# Patient Record
Sex: Female | Born: 1950 | Race: White | Hispanic: No | Smoking: Never smoker
Health system: Southern US, Community
[De-identification: ages and names within clinical notes are randomized; demographics above are authoritative.]

## PROBLEM LIST (undated history)

## (undated) DIAGNOSIS — I10 Essential (primary) hypertension: Secondary | ICD-10-CM

## (undated) DIAGNOSIS — C801 Malignant (primary) neoplasm, unspecified: Secondary | ICD-10-CM

## (undated) HISTORY — PX: OTHER SURGICAL HISTORY: SHX169

---

## 2017-02-07 ENCOUNTER — Encounter (HOSPITAL_COMMUNITY): Payer: Self-pay | Admitting: Cardiology

## 2017-02-07 ENCOUNTER — Emergency Department (HOSPITAL_COMMUNITY)
Admission: EM | Admit: 2017-02-07 | Discharge: 2017-02-07 | Disposition: A | Discharge status: Home / Self Care | Payer: 59 | Attending: Emergency Medicine | Admitting: Emergency Medicine

## 2017-02-07 ENCOUNTER — Emergency Department (HOSPITAL_COMMUNITY): Payer: 59

## 2017-02-07 DIAGNOSIS — R0789 Other chest pain: Secondary | ICD-10-CM

## 2017-02-07 DIAGNOSIS — Z79899 Other long term (current) drug therapy: Secondary | ICD-10-CM | POA: Insufficient documentation

## 2017-02-07 DIAGNOSIS — I10 Essential (primary) hypertension: Secondary | ICD-10-CM | POA: Insufficient documentation

## 2017-02-07 DIAGNOSIS — Z8541 Personal history of malignant neoplasm of cervix uteri: Secondary | ICD-10-CM | POA: Diagnosis not present

## 2017-02-07 DIAGNOSIS — F419 Anxiety disorder, unspecified: Secondary | ICD-10-CM | POA: Diagnosis not present

## 2017-02-07 DIAGNOSIS — R079 Chest pain, unspecified: Secondary | ICD-10-CM | POA: Diagnosis present

## 2017-02-07 DIAGNOSIS — M62838 Other muscle spasm: Secondary | ICD-10-CM | POA: Diagnosis not present

## 2017-02-07 HISTORY — DX: Essential (primary) hypertension: I10

## 2017-02-07 HISTORY — DX: Malignant (primary) neoplasm, unspecified: C80.1

## 2017-02-07 LAB — URINALYSIS, ROUTINE W REFLEX MICROSCOPIC
Bilirubin Urine: NEGATIVE
Glucose, UA: NEGATIVE mg/dL
Hgb urine dipstick: NEGATIVE
Ketones, ur: NEGATIVE mg/dL
NITRITE: NEGATIVE
PROTEIN: NEGATIVE mg/dL
SPECIFIC GRAVITY, URINE: 1.009 (ref 1.005–1.030)
pH: 5 (ref 5.0–8.0)

## 2017-02-07 LAB — COMPREHENSIVE METABOLIC PANEL
ALK PHOS: 139 U/L — AB (ref 38–126)
ALT: 18 U/L (ref 14–54)
AST: 26 U/L (ref 15–41)
Albumin: 3.9 g/dL (ref 3.5–5.0)
Anion gap: 9 (ref 5–15)
BILIRUBIN TOTAL: 1 mg/dL (ref 0.3–1.2)
BUN: 29 mg/dL — ABNORMAL HIGH (ref 6–20)
CALCIUM: 9.2 mg/dL (ref 8.9–10.3)
CHLORIDE: 99 mmol/L — AB (ref 101–111)
CO2: 29 mmol/L (ref 22–32)
CREATININE: 1.29 mg/dL — AB (ref 0.44–1.00)
GFR calc Af Amer: 49 mL/min — ABNORMAL LOW (ref >60)
GFR, EST NON AFRICAN AMERICAN: 42 mL/min — AB (ref >60)
Glucose, Bld: 118 mg/dL — ABNORMAL HIGH (ref 65–99)
Potassium: 3.9 mmol/L (ref 3.5–5.1)
Sodium: 137 mmol/L (ref 135–145)
TOTAL PROTEIN: 7 g/dL (ref 6.5–8.1)

## 2017-02-07 LAB — CBC WITH DIFFERENTIAL/PLATELET
BASOS ABS: 0 10*3/uL (ref 0.0–0.1)
Basophils Relative: 0 %
Eosinophils Absolute: 0.3 10*3/uL (ref 0.0–0.7)
Eosinophils Relative: 2 %
HEMATOCRIT: 37.8 % (ref 36.0–46.0)
HEMOGLOBIN: 12.1 g/dL (ref 12.0–15.0)
LYMPHS ABS: 1.6 10*3/uL (ref 0.7–4.0)
Lymphocytes Relative: 13 %
MCH: 27.5 pg (ref 26.0–34.0)
MCHC: 32 g/dL (ref 30.0–36.0)
MCV: 85.9 fL (ref 78.0–100.0)
Monocytes Absolute: 0.5 10*3/uL (ref 0.1–1.0)
Monocytes Relative: 4 %
NEUTROS ABS: 10 10*3/uL — AB (ref 1.7–7.7)
Neutrophils Relative %: 81 %
Platelets: 335 10*3/uL (ref 150–400)
RBC: 4.4 MIL/uL (ref 3.87–5.11)
RDW: 13 % (ref 11.5–15.5)
WBC: 12.5 10*3/uL — AB (ref 4.0–10.5)

## 2017-02-07 LAB — TROPONIN I

## 2017-02-07 LAB — MAGNESIUM: Magnesium: 2.1 mg/dL (ref 1.7–2.4)

## 2017-02-07 LAB — CK: Total CK: 31 U/L — ABNORMAL LOW (ref 38–234)

## 2017-02-07 MED ORDER — ACETAMINOPHEN 325 MG PO TABS
650.0000 mg | ORAL_TABLET | ORAL | Status: DC | PRN
  Filled 2017-02-07: qty 2

## 2017-02-07 MED ORDER — METHOCARBAMOL 500 MG PO TABS: 500.0000 mg | ORAL_TABLET | Freq: Two times a day (BID) | ORAL | 0 refills | Status: AC | PRN

## 2017-02-07 MED ORDER — METHOCARBAMOL 500 MG PO TABS
500.0000 mg | ORAL_TABLET | Freq: Once | ORAL | Status: AC
  Administered 2017-02-07: 500 mg via ORAL
  Filled 2017-02-07: qty 1

## 2017-02-07 NOTE — ED Notes (Signed)
Pt alert & oriented x4. Patient given discharge instructions, paperwork & prescription(s). Patient verbalized understanding. Pt left department w/ no further questions.  
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
## 2017-02-07 NOTE — ED Triage Notes (Signed)
No answer when called name

## 2017-02-07 NOTE — ED Triage Notes (Addendum)
Right shoulder pain times 2 weeks.  Seen orthopedic doctor and was told she had bursitis and had steroid injection.  Now pt states her upper chest and bilateral shoulders are hurting times 1 week.

## 2018-08-23 IMAGING — DX DG CHEST 2V
3 series · 3 of 3 positions shown · non-contrast
Comparison: None.

CLINICAL DATA: 66-year-old female with right shoulder pain for 2
weeks. Suspect bursitis and status post steroid injection. Now with
1 week of upper chest and bilateral shoulder pain. No specific
injury.

EXAM:
CHEST  2 VIEW

[chest lat (1 of 2)]
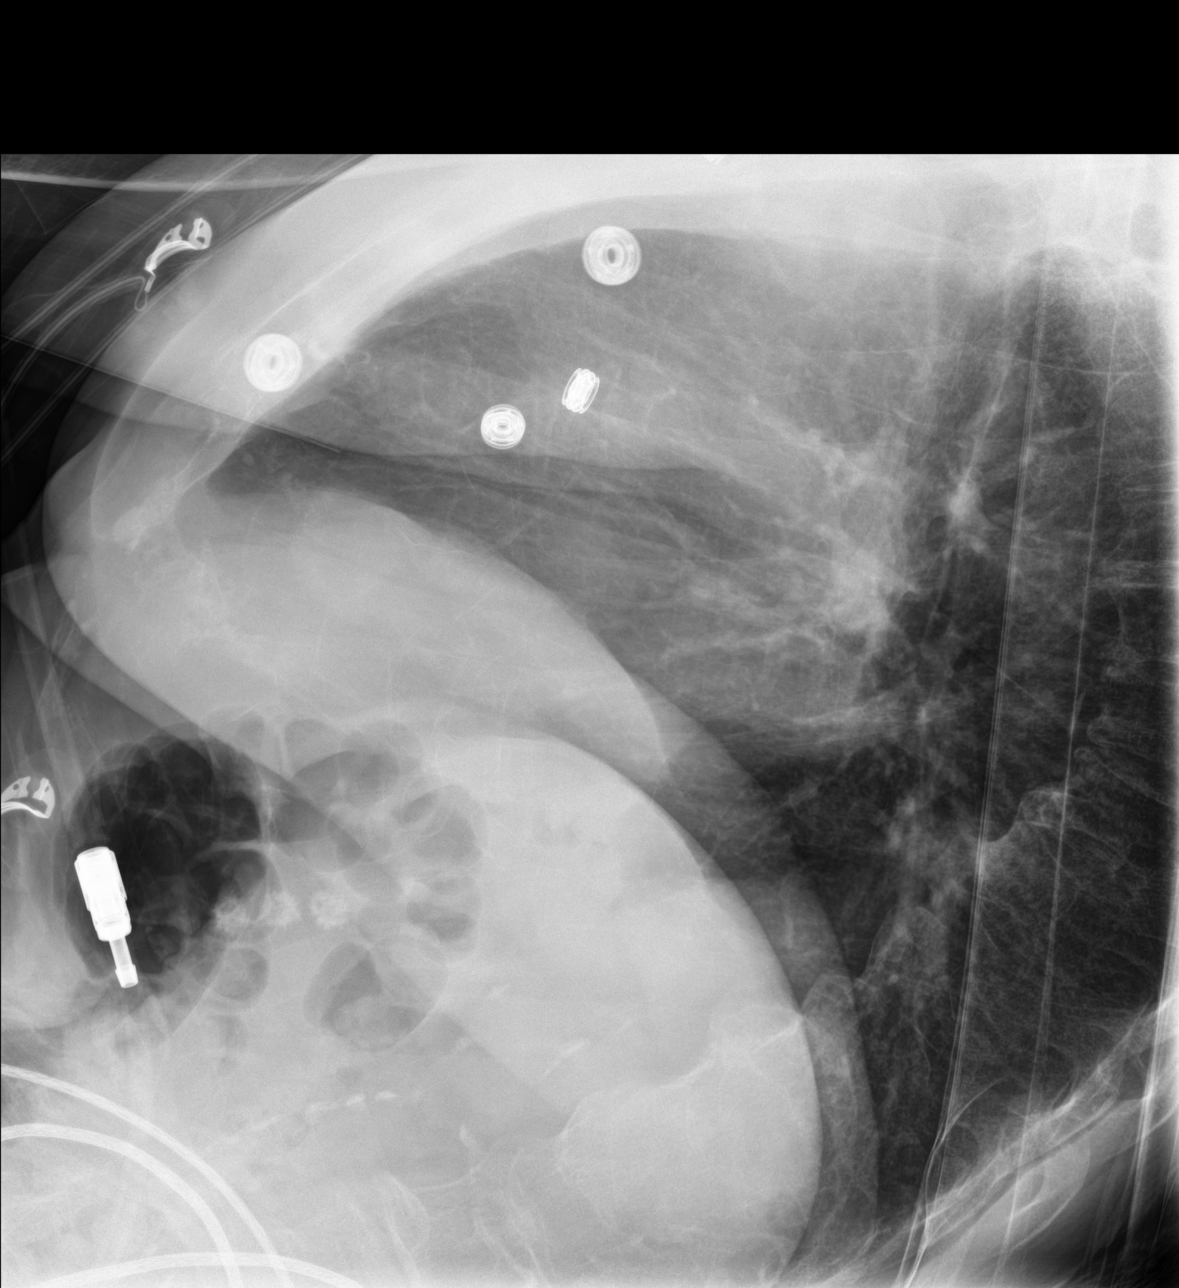

[chest ap]
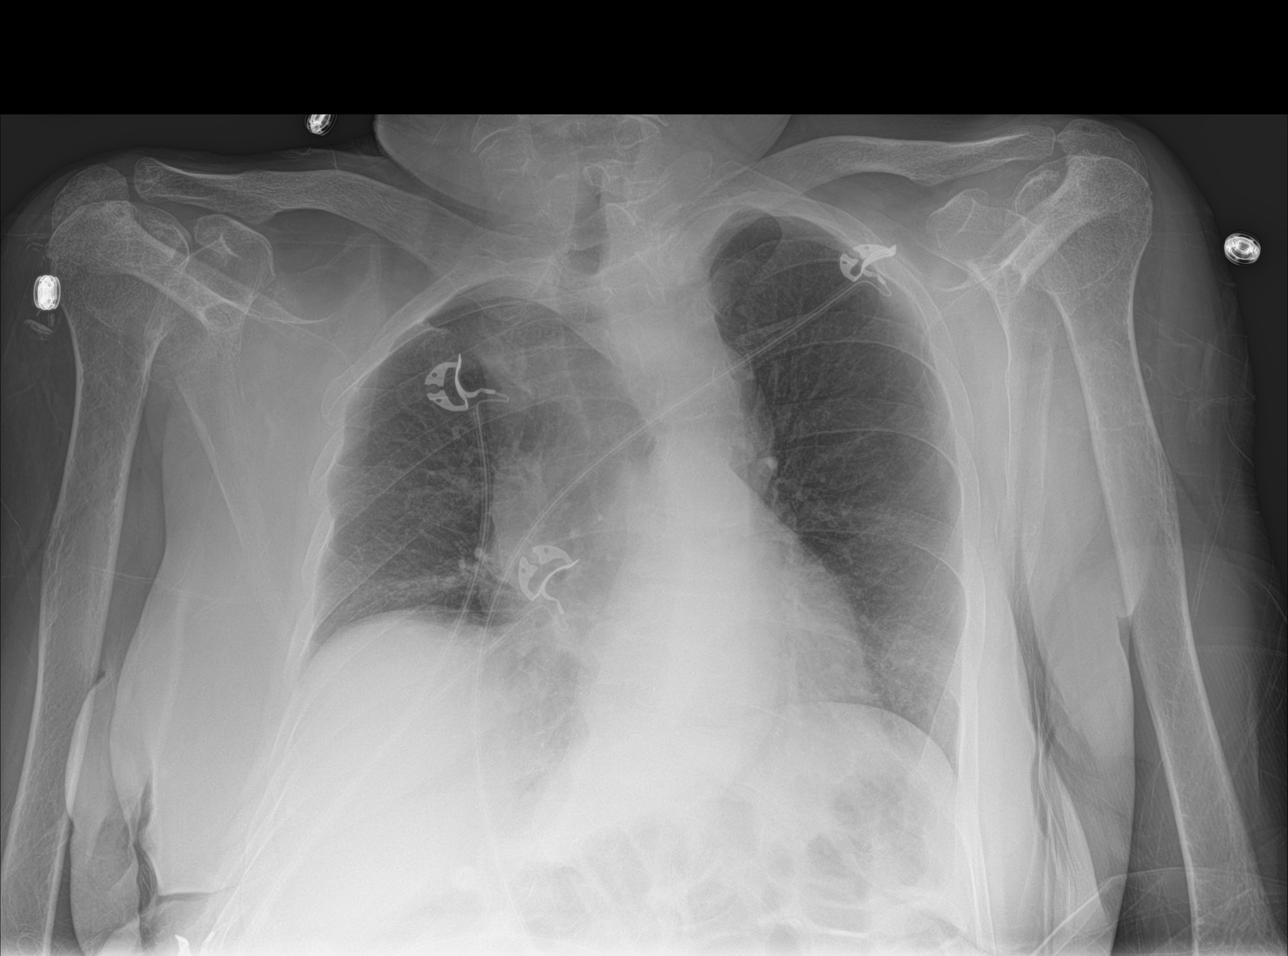

[chest lat (2 of 2)]
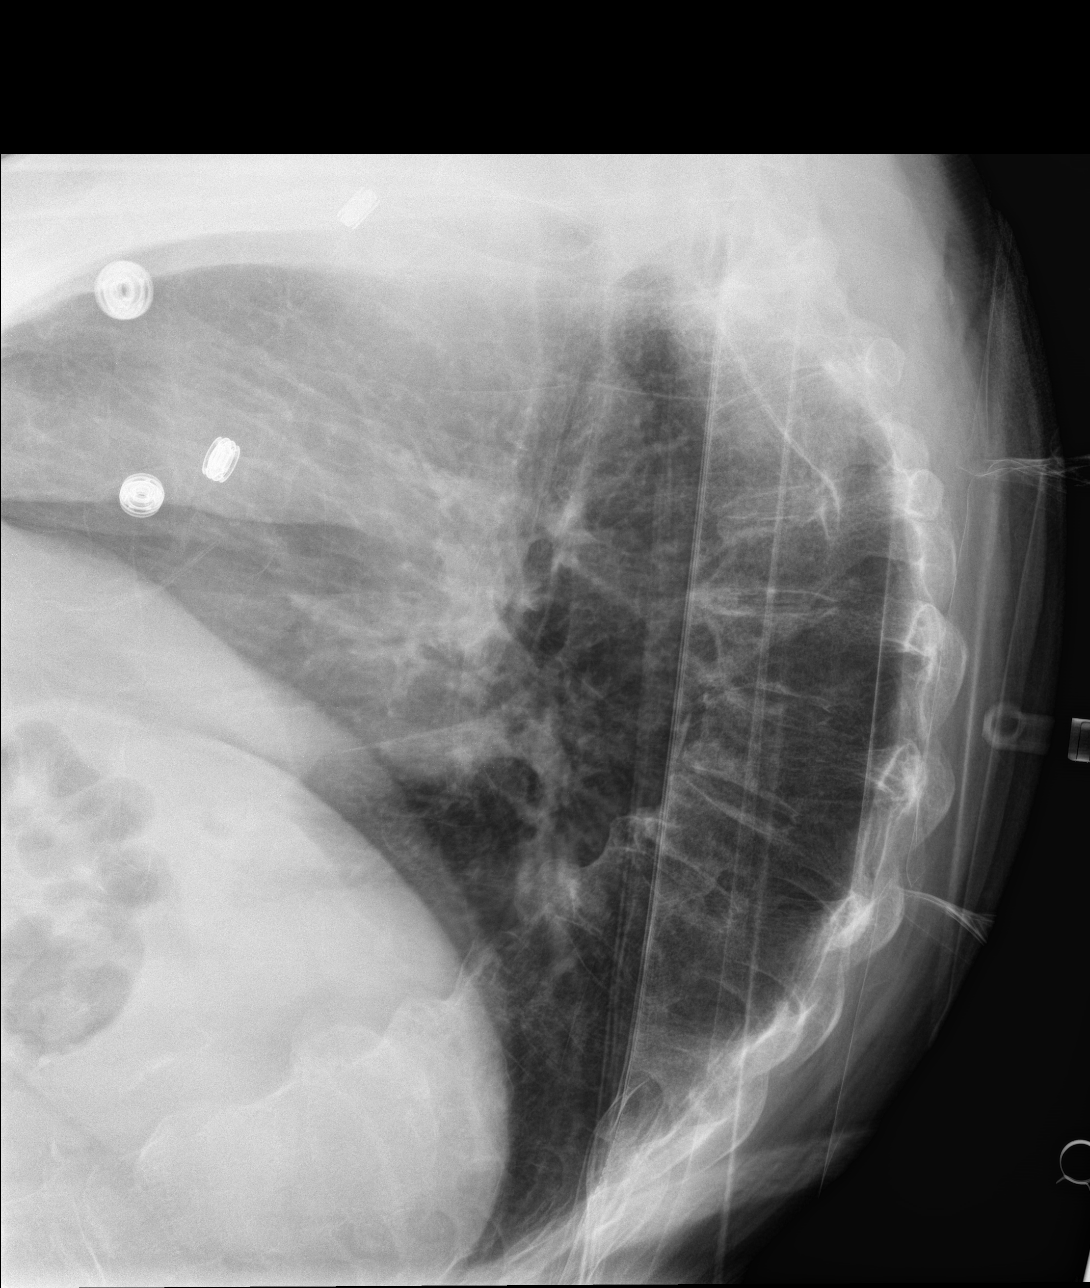

[3 of 3 positions shown; findings below may reference images not displayed]

FINDINGS: Seated upright AP and lateral views of the chest. Levoconvex
thoracic kyphoscoliosis.

Increased AP dimension to the lungs. Possible hyperinflation. Normal
cardiac size and mediastinal contours. Visualized tracheal air
column is within normal limits. No pneumothorax, pulmonary edema,
pleural effusion or confluent pulmonary opacity. Abdominal Calcified
aortic atherosclerosis.

Negative visible bowel gas pattern. No acute osseous abnormality
identified.
IMPRESSION: 1.  No acute cardiopulmonary abnormality.
2. Thoracic kyphoscoliosis.
3. Abdominal Calcified aortic atherosclerosis.
